# Patient Record
Sex: Male | Born: 1995 | Race: White | Hispanic: No | Marital: Single | Smoking: Never smoker
Health system: Southern US, Community
[De-identification: ages and names within clinical notes are randomized; demographics above are authoritative.]

## PROBLEM LIST (undated history)

## (undated) HISTORY — PX: HERNIA REPAIR: SHX51

---
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
## 2016-06-23 ENCOUNTER — Ambulatory Visit: Payer: Medicaid Other

## 2016-06-23 ENCOUNTER — Ambulatory Visit
Admission: EM | Admit: 2016-06-23 | Discharge: 2016-06-23 | Disposition: A | Discharge status: Home / Self Care | Payer: Medicaid Other | Attending: Family Medicine | Admitting: Family Medicine

## 2016-06-23 DIAGNOSIS — X58XXXA Exposure to other specified factors, initial encounter: Secondary | ICD-10-CM | POA: Diagnosis not present

## 2016-06-23 DIAGNOSIS — M79674 Pain in right toe(s): Secondary | ICD-10-CM | POA: Diagnosis present

## 2016-06-23 DIAGNOSIS — S90414A Abrasion, right lesser toe(s), initial encounter: Secondary | ICD-10-CM

## 2016-06-23 DIAGNOSIS — S90211A Contusion of right great toe with damage to nail, initial encounter: Secondary | ICD-10-CM | POA: Diagnosis not present

## 2016-06-23 MED ORDER — MUPIROCIN 2 % EX OINT
TOPICAL_OINTMENT | CUTANEOUS | 0 refills | Status: AC
Start: 2016-06-23 — End: ?

## 2016-06-23 MED ORDER — TETANUS-DIPHTH-ACELL PERTUSSIS 5-2.5-18.5 LF-MCG/0.5 IM SUSP
0.5000 mL | Freq: Once | INTRAMUSCULAR | Status: AC
  Administered 2016-06-23: 0.5 mL via INTRAMUSCULAR

## 2016-06-23 MED ORDER — SULFAMETHOXAZOLE-TRIMETHOPRIM 800-160 MG PO TABS: 1.0000 | ORAL_TABLET | Freq: Two times a day (BID) | ORAL | 0 refills | Status: AC

## 2016-06-23 NOTE — Discharge Instructions (Signed)
Take medication as prescribed. Rest.  Keep clean. Elevate.   Follow up with your primary care physician or podiatry in one week as discused. Return to Urgent care for new or worsening concerns.

## 2016-06-23 NOTE — ED Provider Notes (Signed)
[facility] ____________________________________________  Time seen: Approximately 12:18 PM  I have reviewed the triage vital signs and the nursing notes.   HISTORY  Chief Complaint Toe Injury (Great Toe Right )  HPI Theodore Harris is a 20 y.o. male presents with a complaint of right great toenail pain after injury. Patient reports that this past Saturday he was at work and he was carrying a box of 2-3 watermelons, and states he accidentally dropped the box on his right big toe. Patient reports that he's had some pain since. Patient describes pain is mild. Patient states the toe itself is not tender but the area of the toenails the tender area. Patient reports that initially the toenail had a large bruise underneath it. Patient reports he had been putting Neosporin on the area and states that he believes the toenail fell off. Denies any other pain or injury. Denies fall to the ground. Denies head injury or loss of consciousness. Denies Worker's Compensation injury.  Patient reports that the area where the toenail wasdoes have some intermittent "oozing". States pain to right great toe is mild. Unsure of last tetanus immunization. Denies any other pain or complaints.    History reviewed. No pertinent past medical history.  There are no active problems to display for this patient.   Past Surgical History:  Procedure Laterality Date  . HERNIA REPAIR     No current facility-administered medications for this encounter.   Current Outpatient Prescriptions:  .  mupirocin ointment (BACTROBAN) 2 %, Apply three times a day for 5 days., Disp: 22 g, Rfl: 0 .  sulfamethoxazole-trimethoprim (BACTRIM DS,SEPTRA DS) 800-160 MG tablet, Take 1 tablet by mouth 2 (two) times daily., Disp: 14 tablet, Rfl: 0  Allergies Review of patient's allergies indicates no known allergies.  Family History  Problem Relation Age of Onset  . Hypertension Mother     Social History Social History    Substance Use Topics  . Smoking status: Never Smoker  . Smokeless tobacco: Never Used  . Alcohol use No    Review of Systems Constitutional: No fever/chills Eyes: No visual changes. ENT: No sore throat. Cardiovascular: Denies chest pain. Respiratory: Denies shortness of breath. Gastrointestinal: No abdominal pain.  No nausea, no vomiting.  No diarrhea.  No constipation. Genitourinary: Negative for dysuria. Musculoskeletal: Negative for back pain. Skin: Negative for rash. As above.  Neurological: Negative for headaches, focal weakness or numbness.  10-point ROS otherwise negative.  ____________________________________________   PHYSICAL EXAM:  VITAL SIGNS: ED Triage Vitals  Enc Vitals Group     BP 06/23/16 1041 119/76     Pulse Rate 06/23/16 1041 75     Resp 06/23/16 1041 18     Temp 06/23/16 1041 98.1 F (36.7 C)     Temp Source 06/23/16 1041 Oral     SpO2 06/23/16 1041 99 %     Weight 06/23/16 1041 135 lb (61.2 kg)     Height 06/23/16 1041  (1.702 m)     Head Circumference --      Peak Flow --      Pain Score 06/23/16 1045 0     Pain Loc --      Pain Edu? --      Excl. in GC? --     Constitutional: Alert and oriented. Well appearing and in no acute distress. Eyes: Conjunctivae are normal. PERRL. EOMI. ENT      Head: Normocephalic and atraumatic.      Nose: No congestion/rhinnorhea.  Mouth/Throat: Mucous membranes are moist Cardiovascular: Normal rate, regular rhythm. Grossly normal heart sounds.  Good peripheral circulation. Respiratory: Normal respiratory effort without tachypnea nor retractions. Breath sounds are clear and equal bilaterally. No wheezes/rales/rhonchi.. Musculoskeletal:  Nontender with normal range of motion in all extremities.Bilateral pedal pulses equal and easily palpated. See skin below. Neurologic:  Normal speech and language. No gross focal neurologic deficits are appreciated. Speech is normal. No gait instability.  Skin:  Skin  is warm, dry and intact. No rash noted. Except: Right great toe mild to moderate erythematous nail bed with crusting abrasion appearance, no nail visible or palpated, mild nail bed surrounding erythema and swelling, dorsal toe mild to moderate tenderness to palpation, plantar toe nontender. Erythema is non-circumferential. Right great toe with full range of motion, normal capillary refill, normal sensation. Right foot otherwise nontender. Psychiatric: Mood and affect are normal. Speech and behavior are normal. Patient exhibits appropriate insight and judgment   ___________________________________________   LABS (all labs ordered are listed, but only abnormal results are displayed)  Labs Reviewed - No data to display  RADIOLOGY  Dg Toe Great Right  Result Date: 06/23/2016 CLINICAL DATA:  right great toe injury at work last Saturday. EXAM: RIGHT GREAT TOE COMPARISON:  None FINDINGS: There is no evidence of fracture or dislocation. There is no evidence of arthropathy or other focal bone abnormality. Soft tissues are unremarkable. IMPRESSION: Negative. Electronically Signed   By: Charlett Nose M.D.   On: 06/23/2016 11:18  ____________________________________________   PROCEDURES Procedures   INITIAL IMPRESSION / ASSESSMENT AND PLAN / ED COURSE  Pertinent labs & imaging results that were available during my care of the patient were reviewed by me and considered in my medical decision making (see chart for details).  Very well-appearing patient. No acute distress. Presents with right great toe complaints post injury. Right great toe now appears to have fallen off with erythematous nail bed with mild swelling and tenderness. Mild erythema around the nail bed edge appears inflamed,,  and concern for early infection. tetanus immunization updated.  Patient would not allow further assessment  for any partial nail presents, due to tenderness in the area. Will treat patient with oral Bactrim and  topical Bactroban. Encouraged protection,  elevation, keeping clean as well as close follow-up. Information for podiatry given.  Discussed follow up with Primary care physician this week. Discussed follow up and return parameters including no resolution or any worsening concerns. Patient verbalized understanding and agreed to plan.   ____________________________________________   FINAL CLINICAL IMPRESSION(S) / ED DIAGNOSES  Final diagnoses:  Contusion of right great toe with damage to nail, initial encounter  Toe abrasion, right, initial encounter     There are no discharge medications for this patient.   Note: This dictation was prepared with Dragon dictation along with smaller phrase technology. Any transcriptional errors that result from this process are unintentional.    Clinical Course     Renford Dills, NP 06/23/16 1318

## 2016-06-23 NOTE — ED Triage Notes (Signed)
Patient was carrying a trash can full of watermelons and dropped it on his toe on Saturday.No pain

## 2017-09-30 DEATH — deceased
